# Patient Record
Sex: Female | Born: 1980 | Race: White | Hispanic: No | Marital: Single | Smoking: Never smoker
Health system: Southern US, Community
[De-identification: ages and names within clinical notes are randomized; demographics above are authoritative.]

## PROBLEM LIST (undated history)

## (undated) DIAGNOSIS — F32A Depression, unspecified: Secondary | ICD-10-CM

## (undated) DIAGNOSIS — M810 Age-related osteoporosis without current pathological fracture: Secondary | ICD-10-CM

## (undated) DIAGNOSIS — F329 Major depressive disorder, single episode, unspecified: Secondary | ICD-10-CM

## (undated) DIAGNOSIS — M84376A Stress fracture, unspecified foot, initial encounter for fracture: Secondary | ICD-10-CM

## (undated) DIAGNOSIS — F509 Eating disorder, unspecified: Secondary | ICD-10-CM

## (undated) HISTORY — DX: Major depressive disorder, single episode, unspecified: F32.9

## (undated) HISTORY — DX: Stress fracture, unspecified foot, initial encounter for fracture: M84.376A

## (undated) HISTORY — DX: Eating disorder, unspecified: F50.9

## (undated) HISTORY — DX: Depression, unspecified: F32.A

## (undated) HISTORY — DX: Age-related osteoporosis without current pathological fracture: M81.0

---

## 2011-07-05 DIAGNOSIS — F329 Major depressive disorder, single episode, unspecified: Secondary | ICD-10-CM | POA: Insufficient documentation

## 2011-07-05 DIAGNOSIS — F32A Depression, unspecified: Secondary | ICD-10-CM | POA: Insufficient documentation

## 2011-07-05 DIAGNOSIS — N912 Amenorrhea, unspecified: Secondary | ICD-10-CM | POA: Insufficient documentation

## 2011-07-05 DIAGNOSIS — R001 Bradycardia, unspecified: Secondary | ICD-10-CM | POA: Insufficient documentation

## 2011-07-18 DIAGNOSIS — E559 Vitamin D deficiency, unspecified: Secondary | ICD-10-CM | POA: Insufficient documentation

## 2014-03-10 ENCOUNTER — Ambulatory Visit (INDEPENDENT_AMBULATORY_CARE_PROVIDER_SITE_OTHER): Payer: BC Managed Care – PPO | Admitting: Physician Assistant

## 2014-03-10 VITALS — BP 86/60 | HR 74 | Temp 97.8°F | Resp 18 | Ht 60.0 in | Wt 73.0 lb

## 2014-03-10 DIAGNOSIS — F509 Eating disorder, unspecified: Secondary | ICD-10-CM

## 2014-03-10 DIAGNOSIS — M810 Age-related osteoporosis without current pathological fracture: Secondary | ICD-10-CM

## 2014-03-10 DIAGNOSIS — R109 Unspecified abdominal pain: Secondary | ICD-10-CM

## 2014-03-10 LAB — POCT URINALYSIS DIPSTICK
BILIRUBIN UA: NEGATIVE
Blood, UA: NEGATIVE
GLUCOSE UA: NEGATIVE
Ketones, UA: NEGATIVE
Leukocytes, UA: NEGATIVE
NITRITE UA: NEGATIVE
Protein, UA: NEGATIVE
Spec Grav, UA: 1.01
Urobilinogen, UA: 0.2
pH, UA: 7

## 2014-03-10 LAB — POCT UA - MICROSCOPIC ONLY
Bacteria, U Microscopic: NEGATIVE
Casts, Ur, LPF, POC: NEGATIVE
Crystals, Ur, HPF, POC: NEGATIVE
Epithelial cells, urine per micros: NEGATIVE
Mucus, UA: NEGATIVE
RBC, URINE, MICROSCOPIC: NEGATIVE
WBC, UR, HPF, POC: NEGATIVE
Yeast, UA: NEGATIVE

## 2014-03-10 NOTE — Patient Instructions (Signed)
HOW TO GET YOUR KID UNCLOGGED ASAP (copied from It's No Accident by Dr. Hodges, pp. 54-55)  Initial Clean Out: Start this process on a Friday night, so you have the entire weekend to get the job done. For children lighter than 45 pounds, mix seven doses of MiraLAX or generic brand powder (one dose is a cap filled to the line) in 32 ounces of Gatorade, Pedialyte, or other clear, noncarbonated liquid. Gatorade and Pedialyte help prevent dehydration because they contain electrolytes.  Plus, they taste good, so kids may be more motivated to drink the full dose.  Have your child drink the entire bottle over twenty-four hours. She can eat anything she wants, but should drink only this fluid. Kids weighing between forty-five and eighty pounds should get ten doses, and those weighing at least eighty pounds should receive fourteen doses of laxative in 64 ounces of fluid over the first twenty-four hours.  Don't give her less than this amount! You may even need to give her more.  The endpoint you're shooting for is watery poop.  Your child should pass five or six stools within twenty-four to forty-eight hours.  If she doesn't, keep her on the maintenance dose described below and try the initial clean out program again the next weekend, keeping her on a daily capful of MiraLAX during the week.  Maintenance: Following the bowel clearing, give your child one cap of the laxative in 8 ounces of fluid daily, and have her sit on the toilet after all meals, especially breakfast, using a footstool for support if necessary.  She should blow out while pooping, which will help her bottom muscles relax.   

## 2014-03-10 NOTE — Progress Notes (Signed)
Subjective:    Patient ID: Nancy Douglas, female    DOB: 1981/03/30, 33 y.o.   MRN: [id number]   PCP: No PCP Per Patient  Chief Complaint  Patient presents with  . Abdominal Pain    for a week--left side--front and back pain--no fever--no chills--no vomiting      Active Ambulatory Problems    Diagnosis Date Noted  . Eating disorder    Resolved Ambulatory Problems    Diagnosis Date Noted  . No Resolved Ambulatory Problems   Past Medical History  Diagnosis Date  . Depression   . Osteoporosis     History reviewed. No pertinent past surgical history.  No Known Allergies  Prior to Admission medications   Medication Sig Start Date End Date Taking? Authorizing Provider  FLUoxetine (PROZAC) 20 MG capsule Take 80 mg by mouth daily.   Yes Historical Provider, MD    History   Social History  . Marital Status: Single    Spouse Name: N/A    Number of Children: N/A  . Years of Education: N/A   Social History Main Topics  . Smoking status: Never Smoker   . Smokeless tobacco: None  . Alcohol Use: No  . Drug Use: No  . Sexual Activity: None   Other Topics Concern  . None   Social History Narrative  . None    family history includes Diabetes in her maternal grandmother and paternal grandmother; Heart disease in her paternal grandmother; Hyperlipidemia in her paternal grandfather and paternal grandmother; Stroke in her paternal grandfather and paternal grandmother. indicated that her mother is alive. She indicated that her father is alive. She indicated that both of her brothers are alive. She indicated that her maternal grandmother is deceased. She indicated that her maternal grandfather is deceased. She indicated that her paternal grandmother is deceased. She indicated that her paternal grandfather is deceased.   HPI  This 33 y.o. female presents for evaluation of LEFT flank pain x 1 week.  Nancy Douglas is new to [place], having moved her from [place] in  August after completing graduate work in Psychologist, sport and exercise at [employer].  She has not established with providers in this area for management of her chronic medical problems nor health maintenance.  Her psychiatrist in [place] has been willing to continue filling her fluoxetine thus far. She tried to establish at [facility], but can't get in for several months.  My name appeared on their website's list of recommended providers, and she presents requesting to see me.  She reports that she feels constipated.  She's experienced pain similar to this previously, but pain is worse this time.  More sharp and stabbing. Worse with deep breath.  She's tried Miralax 1-2 doses a day, but without much relief.  Hasn't tried, and never used, stimulant laxatives.  Tried a dose of Magesium Citrate, but felt like her results were just fluids, not stool.  No fever, chills, nausea.  No urinary urgency, frequency or burning.  She notes that she is in a hurry-she is scheduled to meet with a patient in 30 minutes, so would like to avoid diagnostics that would take much time.   Review of Systems As above. No CP, SOB, HA, Dizziness.    Objective:   Physical Exam  Vitals reviewed. Constitutional: She is oriented to person, place, and time. Vital signs are normal. She appears well-developed and well-nourished. She is active and cooperative. No distress.  BP 86/60  Pulse 74  Temp(Src) 97.8 F (36.6  C) (Oral)  Resp 18  Ht 5' (1.524 m)  Wt 73 lb (33.113 kg)  BMI 14.26 kg/m2  SpO2 100%  HENT:  Head: Normocephalic and atraumatic.  Right Ear: Hearing, tympanic membrane, external ear and ear canal normal.  Left Ear: Hearing, tympanic membrane, external ear and ear canal normal.  Nose: Nose normal.  Mouth/Throat: Uvula is midline, oropharynx is clear and moist and mucous membranes are normal.  Considerable enlargement of the parotids bilaterally. Soft and non-tender.  Eyes: Conjunctivae are normal. No scleral  icterus.  Neck: Trachea normal and normal range of motion. Neck supple. No thyromegaly present.  Cardiovascular: Normal rate, regular rhythm, normal heart sounds and normal pulses.   Pulses:      Radial pulses are 2+ on the right side, and 2+ on the left side.  Pulmonary/Chest: Effort normal and breath sounds normal.  Abdominal: Normal appearance and bowel sounds are normal. There is no hepatosplenomegaly. There is tenderness in the suprapubic area, left upper quadrant and left lower quadrant. There is no CVA tenderness. No hernia.  Palpable stool on the LEFT lower abdomen.  Lymphadenopathy:       Head (right side): No tonsillar, no preauricular, no posterior auricular and no occipital adenopathy present.       Head (left side): No tonsillar, no preauricular, no posterior auricular and no occipital adenopathy present.    She has no cervical adenopathy.       Right: No supraclavicular adenopathy present.       Left: No supraclavicular adenopathy present.  Neurological: She is alert and oriented to person, place, and time. No sensory deficit.  Skin: Skin is warm, dry and intact. No rash noted. No cyanosis or erythema. Nails show no clubbing.  Psychiatric: She has a normal mood and affect. Her speech is normal and behavior is normal.      Results for orders placed in visit on 03/10/14  POCT UA - MICROSCOPIC ONLY      Result Value Ref Range   WBC, Ur, HPF, POC neg     RBC, urine, microscopic neg     Bacteria, U Microscopic neg     Mucus, UA neg     Epithelial cells, urine per micros neg     Crystals, Ur, HPF, POC neg     Casts, Ur, LPF, POC neg     Yeast, UA neg    POCT URINALYSIS DIPSTICK      Result Value Ref Range   Color, UA yellow     Clarity, UA clear     Glucose, UA neg     Bilirubin, UA neg     Ketones, UA neg     Spec Grav, UA 1.010     Blood, UA neg     pH, UA 7.0     Protein, UA neg     Urobilinogen, UA 0.2     Nitrite, UA neg     Leukocytes, UA Negative           Assessment & Plan:  1. Abdominal pain, unspecified abdominal location She's in a hurry, and so we elect to defer additional evaluation for now.  If her symptoms persist, she'll return. Otherwise, try a Miralax clean-out.  Based on her weight, 10 doses of Miralax in 64 ounces of Gatorade.  Anticipatory guidance provided. - POCT UA - Microscopic Only - POCT urinalysis dipstick  2. Eating disorder I'm happy to prescribe fluoxetine. She's going to work on establishing with a nutritionist and therapist  locally.  3. Osteoporosis Will need to update this 2 years from her last DEXA.   Fernande Bras, PA-C Physician Assistant-Certified [facility]

## 2014-04-06 ENCOUNTER — Ambulatory Visit (INDEPENDENT_AMBULATORY_CARE_PROVIDER_SITE_OTHER): Payer: BC Managed Care – PPO

## 2014-04-06 ENCOUNTER — Ambulatory Visit (INDEPENDENT_AMBULATORY_CARE_PROVIDER_SITE_OTHER): Payer: BC Managed Care – PPO | Admitting: Physician Assistant

## 2014-04-06 VITALS — BP 84/64 | HR 57 | Temp 97.5°F | Resp 16 | Ht 59.25 in | Wt 76.0 lb

## 2014-04-06 DIAGNOSIS — M79641 Pain in right hand: Secondary | ICD-10-CM

## 2014-04-06 DIAGNOSIS — M810 Age-related osteoporosis without current pathological fracture: Secondary | ICD-10-CM

## 2014-04-06 DIAGNOSIS — M79672 Pain in left foot: Secondary | ICD-10-CM

## 2014-04-06 DIAGNOSIS — M79671 Pain in right foot: Secondary | ICD-10-CM

## 2014-04-06 DIAGNOSIS — Z87312 Personal history of (healed) stress fracture: Secondary | ICD-10-CM

## 2014-04-06 NOTE — Patient Instructions (Signed)
Try to rest your heels in case a stress fracture is forming again - recumbent bike or elliptical machine would be better than running/walking for exercise. Work on range of motion exercises for your right thumb.  Start taking calcium and vitamin D daily.

## 2014-04-06 NOTE — Progress Notes (Signed)
Subjective:    Patient ID: Nancy Douglas, female    DOB: May 16, 1981, 33 y.o.   MRN: [id number] Patient Active Problem List   Diagnosis Date Noted  . Osteoporosis 03/10/2014  . Eating disorder    Prior to Admission medications   Medication Sig Start Date End Date Taking? Authorizing Provider  FLUoxetine (PROZAC) 20 MG capsule Take 80 mg by mouth daily.   Yes Historical Provider, MD   Hand Pain   Foot Pain Associated symptoms include arthralgias and joint swelling. Pertinent negatives include no headaches.    This is a 33 year old right hand dominant female with PMH eating disorder and osteoporosis who is presenting with right hand pain and swelling x 3 days. She reports she tripped going up stairs and caught herself with her right hand outstretched in front of her. She reports she is stiff and sore.  She took ibuprofen one time which helped some. She has never had an injury in her right hand before. She is not having weakness or paresthesias.   She is also reporting bilateral heel pain for 2 weeks. She reports around January of 2015 she started having bilateral heel pain and was diagnosed with bilateral calcaneal stress fractures. The left heel stress fracture was diagnosed by xray, while the right was diagnosed by MRI. She reports she wore a boot intermittently but only when the heel pain was acute. She mostly treated with rest. Her symptoms eventually completely resolved. She reports the pain she is having now is the same as prior. She still has a boot at home. She does not take calcium or vitamin D on a regular basis. She runs for exercise.  Review of Systems  Constitutional: Negative.   Musculoskeletal: Positive for arthralgias and joint swelling.  Skin: Negative.   Neurological: Negative for headaches.      Objective:   Physical Exam  Constitutional: She is oriented to person, place, and time. She appears well-developed and well-nourished. No distress.  HENT:  Head:  Normocephalic and atraumatic.  Right Ear: Hearing normal.  Left Ear: Hearing normal.  Nose: Nose normal.  Bilateral parotid gland swelling  Eyes: Conjunctivae and lids are normal. Right eye exhibits no discharge. Left eye exhibits no discharge. No scleral icterus.  Cardiovascular: Intact distal pulses.  Bradycardia present.   Pulmonary/Chest: Effort normal. No respiratory distress.  Musculoskeletal: Normal range of motion.       Right hand: She exhibits tenderness (1st metacarpal head), bony tenderness and swelling. She exhibits normal range of motion and normal capillary refill. Normal sensation noted. Decreased strength (4+/5) noted. She exhibits thumb/finger opposition. She exhibits no finger abduction and no wrist extension trouble.       Right foot: She exhibits tenderness (medial calcaneus). She exhibits no swelling.       Left foot: She exhibits tenderness (medial calcaneus). She exhibits normal range of motion and no swelling.  No scaphoid tenderness  Neurological: She is alert and oriented to person, place, and time. She has normal reflexes. No sensory deficit.  Reflex Scores:      Bicep reflexes are 2+ on the right side and 2+ on the left side.      Brachioradialis reflexes are 2+ on the right side and 2+ on the left side.      Achilles reflexes are 2+ on the right side and 2+ on the left side. Skin: Skin is warm, dry and intact. No lesion and no rash noted.  Psychiatric: She has a normal mood and  affect. Her speech is normal and behavior is normal. Thought content normal.   [facility] reading (PRIMARY) by  Dr. Merla Richesoolittle: right hand, right wrist and bilateral calcaneal radiograph all negative for acute fracture.      Assessment & Plan:  1. Heel pain, bilateral 2. Personal history of stress fracture  There is no evidence of fracture on bilateral calcaneal radiographs. Patient does not want an ortho referral at this time. She will return if her pain worsens. We discussed the need to  rest and do lower impact exercise. Presently, she runs a few times a week. She discussed a recumbent bike or elliptical machine would be better than running.  - DG Os Calcis Right; Future - DG Os Calcis Left; Future  3. Hand pain, right  Radiograph is negative for fracture. This is likely a muscle strain. We discussed range of motion exercises to complete at home.  - DG Hand Complete Right; Future - DG Wrist Complete Right; Future  4. Osteoporosis Patient has a history of osteoporosis d/t eating disorder. She does not currently take calcium or vitamin D. We discussed the importance of taking this daily to prevent fractures and accelerate bone healing.  Roswell MinersNicole V. Dyke BrackettBush, PA-C, MHS [facility]  04/06/2014

## 2014-04-09 NOTE — Progress Notes (Signed)
I was directly involved with the patient's care and agree with the physical, diagnosis and treatment plan.  

## 2014-12-23 ENCOUNTER — Ambulatory Visit: Payer: Self-pay | Admitting: *Deleted

## 2015-01-06 ENCOUNTER — Ambulatory Visit: Payer: Self-pay | Admitting: *Deleted

## 2015-04-20 ENCOUNTER — Ambulatory Visit (INDEPENDENT_AMBULATORY_CARE_PROVIDER_SITE_OTHER): Payer: BLUE CROSS/BLUE SHIELD | Admitting: Physician Assistant

## 2015-04-20 ENCOUNTER — Encounter: Payer: Self-pay | Admitting: Physician Assistant

## 2015-04-20 VITALS — BP 89/51 | HR 59 | Temp 98.0°F | Resp 16 | Ht 59.5 in | Wt 80.4 lb

## 2015-04-20 DIAGNOSIS — Z13228 Encounter for screening for other metabolic disorders: Secondary | ICD-10-CM

## 2015-04-20 DIAGNOSIS — Z1329 Encounter for screening for other suspected endocrine disorder: Secondary | ICD-10-CM | POA: Diagnosis not present

## 2015-04-20 DIAGNOSIS — Z114 Encounter for screening for human immunodeficiency virus [HIV]: Secondary | ICD-10-CM | POA: Diagnosis not present

## 2015-04-20 DIAGNOSIS — M810 Age-related osteoporosis without current pathological fracture: Secondary | ICD-10-CM

## 2015-04-20 DIAGNOSIS — Z13220 Encounter for screening for lipoid disorders: Secondary | ICD-10-CM

## 2015-04-20 DIAGNOSIS — F509 Eating disorder, unspecified: Secondary | ICD-10-CM | POA: Diagnosis not present

## 2015-04-20 DIAGNOSIS — F329 Major depressive disorder, single episode, unspecified: Secondary | ICD-10-CM | POA: Diagnosis not present

## 2015-04-20 DIAGNOSIS — F32A Depression, unspecified: Secondary | ICD-10-CM

## 2015-04-20 DIAGNOSIS — Z130 Encounter for screening for diseases of the blood and blood-forming organs and certain disorders involving the immune mechanism: Secondary | ICD-10-CM

## 2015-04-20 DIAGNOSIS — Z87312 Personal history of (healed) stress fracture: Secondary | ICD-10-CM

## 2015-04-20 DIAGNOSIS — Z23 Encounter for immunization: Secondary | ICD-10-CM

## 2015-04-20 DIAGNOSIS — Z0000 Encounter for general adult medical examination without abnormal findings: Secondary | ICD-10-CM | POA: Diagnosis not present

## 2015-04-20 LAB — CBC WITH DIFFERENTIAL/PLATELET
BASOS PCT: 0 % (ref 0–1)
Basophils Absolute: 0 10*3/uL (ref 0.0–0.1)
EOS ABS: 0 10*3/uL (ref 0.0–0.7)
EOS PCT: 1 % (ref 0–5)
HCT: 35.9 % — ABNORMAL LOW (ref 36.0–46.0)
Hemoglobin: 11.9 g/dL — ABNORMAL LOW (ref 12.0–15.0)
Lymphocytes Relative: 26 % (ref 12–46)
Lymphs Abs: 1.2 10*3/uL (ref 0.7–4.0)
MCH: 28.5 pg (ref 26.0–34.0)
MCHC: 33.1 g/dL (ref 30.0–36.0)
MCV: 85.9 fL (ref 78.0–100.0)
MONOS PCT: 7 % (ref 3–12)
MPV: 8.2 fL — AB (ref 8.6–12.4)
Monocytes Absolute: 0.3 10*3/uL (ref 0.1–1.0)
NEUTROS PCT: 66 % (ref 43–77)
Neutro Abs: 3.2 10*3/uL (ref 1.7–7.7)
Platelets: 295 10*3/uL (ref 150–400)
RBC: 4.18 MIL/uL (ref 3.87–5.11)
RDW: 14.2 % (ref 11.5–15.5)
WBC: 4.8 10*3/uL (ref 4.0–10.5)

## 2015-04-20 LAB — TSH: TSH: 1.177 u[IU]/mL (ref 0.350–4.500)

## 2015-04-20 NOTE — Patient Instructions (Signed)
I will contact you with your lab results as soon as they are available.   If you have not heard from me in 2 weeks, please contact me.  The fastest way to get your results is to register for My Chart (see the instructions on the last page of this printout).  Keeping You Healthy  Get These Tests 1. Blood Pressure- Have your blood pressure checked once a year by your health care provider.  Normal blood pressure is 120/80. 2. Weight- Have your body mass index (BMI) calculated to screen for obesity.  BMI is measure of body fat based on height and weight.  You can also calculate your own BMI at www.nhlbisupport.com/bmi/. 3. Cholesterol- Have your cholesterol checked every 5 years starting at age 20 then yearly starting at age 45. 4. Chlamydia, HIV, and other sexually transmitted diseases- Get screened every year until age 25, then within three months of each new sexual provider. 5. Pap Test - Every 1-5 years; discuss with your health care provider. 6. Mammogram- Every 1-2 years starting at age 40--50  Take these medicines  Calcium with Vitamin D-Your body needs 1200 mg of Calcium each day and 800-1000 IU of Vitamin D daily.  Your body can only absorb 500 mg of Calcium at a time so Calcium must be taken in 2 or 3 divided doses throughout the day.  Multivitamin with folic acid- Once daily if it is possible for you to become pregnant.  Get these Immunizations  Gardasil-Series of three doses; prevents HPV related illness such as genital warts and cervical cancer.  Menactra-Single dose; prevents meningitis.  Tetanus shot- Every 10 years.  Flu shot-Every year.  Take these steps 1. Do not smoke-Your healthcare provider can help you quit.  For tips on how to quit go to www.smokefree.gov or call 1-800 QUITNOW. 2. Be physically active- Exercise 5 days a week for at least 30 minutes.  If you are not already physically active, start slow and gradually work up to 30 minutes of moderate physical  activity.  Examples of moderate activity include walking briskly, dancing, swimming, bicycling, etc. 3. Breast Cancer- A self breast exam every month is important for early detection of breast cancer.  For more information and instruction on self breast exams, ask your healthcare provider or www.womenshealth.gov/faq/breast-self-exam.cfm. 4. Eat a healthy diet- Eat a variety of healthy foods such as fruits, vegetables, whole grains, low fat milk, low fat cheeses, yogurt, lean meats, poultry and fish, beans, nuts, tofu, etc.  For more information go to www. Thenutritionsource.org 5. Drink alcohol in moderation- Limit alcohol intake to one drink or less per day. Never drink and drive. 6. Depression- Your emotional health is as important as your physical health.  If you're feeling down or losing interest in things you normally enjoy please talk to your healthcare provider about being screened for depression. 7. Dental visit- Brush and floss your teeth twice daily; visit your dentist twice a year. 8. Eye doctor- Get an eye exam at least every 2 years. 9. Helmet use- Always wear a helmet when riding a bicycle, motorcycle, rollerblading or skateboarding. 10. Safe sex- If you may be exposed to sexually transmitted infections, use a condom. 11. Seat belts- Seat belts can save your live; always wear one. 12. Smoke/Carbon Monoxide detectors- These detectors need to be installed on the appropriate level of your home. Replace batteries at least once a year. 13. Skin cancer- When out in the sun please cover up and use sunscreen 15 SPF   or higher. 14. Violence- If anyone is threatening or hurting you, please tell your healthcare provider.        

## 2015-04-20 NOTE — Progress Notes (Signed)
Patient ID: Nancy Douglas, female    DOB: Nov 15, 1980, 34 y.o.   MRN: [id number]  PCP: No PCP Per Patient  Chief Complaint  Patient presents with  . Annual Exam    no pap smear  . Depression    in triage answers positive SEE LAST ANSWER    Subjective:   HPI: Presents for annual wellness exam. I last saw her in 02/2014.  Today she desires to establish with me for primary care and have a physical, "I've never had one."  Isn't sexually active. Never has been. She is therefore very low risk for HPV and cervical cancer, and does not want screening today.  Depression currently managed by Deatra Robinson, NP with [facility]. Feels like things are stable in that respect. Has a good rapport with Ms. Yetta Barre. Didn't find talk therapy helpful so stopped attending those sessions.  Had a metatarsal stress fracture last fall, and has had one prior to that. Saw [place] in 03/2014 and wasn't interested in speciality care. Then self-referred [facility], but didn't follow-up. Has been  Cycling. Wants to run again. Thinks that her stress fracture is healed, but sometimes has some discomfort. Isn't taking supplemental calcium or vitamin D. Osteoporosis on DEXA in 2015, previously on Vit D 50,000 IU.   Patient Active Problem List   Diagnosis Date Noted  . Osteoporosis 03/10/2014  . Eating disorder   . Vitamin D deficiency 07/18/2011  . Absence of menstruation 07/05/2011  . Bradycardia 07/05/2011  . Clinical depression 07/05/2011    Past Medical History  Diagnosis Date  . Depression   . Osteoporosis   . Eating disorder   . Stress fracture of metatarsal bone      Prior to Admission medications   Medication Sig Start Date End Date Taking? Authorizing Provider  FLUoxetine (PROZAC) 20 MG capsule Take 60 mg by mouth daily.    Yes Historical Provider, MD    No Known Allergies  History reviewed. No pertinent past surgical history.  Family History  Problem Relation Age  of Onset  . Diabetes Maternal Grandmother   . Diabetes Paternal Grandmother   . Heart disease Paternal Grandmother   . Hyperlipidemia Paternal Grandmother   . Stroke Paternal Grandmother   . Stroke Paternal Grandfather   . Hyperlipidemia Paternal Grandfather   . Mental illness Brother     depression  . Mental illness Brother     depression    Social History   Social History  . Marital Status: Single    Spouse Name: n/a  . Number of Children: 0  . Years of Education: N/A   Occupational History  . speech pathologist    Social History Main Topics  . Smoking status: Never Smoker   . Smokeless tobacco: Never Used  . Alcohol Use: No  . Drug Use: No  . Sexual Activity: No     Comment: never sexually active   Other Topics Concern  . None   Social History Narrative   Lives alone.   Graduated from [employer].       Review of Systems  Constitutional: Negative.   HENT: Positive for sore throat (x 1 week, I feel like it's from the change in the weather and being around sick clients; ibuprofen, cough drops with temporary relief.).   Eyes: Negative.   Respiratory: Negative.   Cardiovascular: Negative.   Gastrointestinal: Negative.   Genitourinary: Positive for urgency (intermittent, can be several times a day; a couple of  times each month), frequency and menstrual problem (amenorrhea). Negative for dysuria and hematuria.  Musculoskeletal: Negative.   Skin: Negative.   Allergic/Immunologic: Negative.   Neurological: Negative.   Hematological: Negative.   Psychiatric/Behavioral: Positive for suicidal ideas (no intention/plan; family, sense of responsibility and perseverence) and dysphoric mood. The patient is nervous/anxious.         Objective:  Physical Exam  Constitutional: She is oriented to person, place, and time. Vital signs are normal. She appears well-developed and well-nourished. She is active and cooperative. No distress.  BP 89/51 mmHg  Pulse 59  Temp(Src) 98  F (36.7 C) (Oral)  Resp 16  Ht 4' 11.5" (1.511 m)  Wt 80 lb 6.4 oz (36.469 kg)  BMI 15.97 kg/m2  LMP    HENT:  Head: Normocephalic and atraumatic.  Right Ear: Hearing, tympanic membrane, external ear and ear canal normal. No foreign bodies.  Left Ear: Hearing, tympanic membrane, external ear and ear canal normal. No foreign bodies.  Nose: Nose normal.  Mouth/Throat: Uvula is midline, oropharynx is clear and moist and mucous membranes are normal. No oral lesions. Normal dentition. No dental abscesses or uvula swelling. No oropharyngeal exudate.  Marked parotid enlargement, her baseline  Eyes: Conjunctivae, EOM and lids are normal. Pupils are equal, round, and reactive to light. Right eye exhibits no discharge. Left eye exhibits no discharge. No scleral icterus.  Fundoscopic exam:      The right eye shows no arteriolar narrowing, no AV nicking, no exudate, no hemorrhage and no papilledema. The right eye shows red reflex.       The left eye shows no arteriolar narrowing, no AV nicking, no exudate, no hemorrhage and no papilledema. The left eye shows red reflex.  Neck: Trachea normal, normal range of motion and full passive range of motion without pain. Neck supple. No spinous process tenderness and no muscular tenderness present. No thyroid mass and no thyromegaly present.  Cardiovascular: Normal rate, regular rhythm, normal heart sounds, intact distal pulses and normal pulses.   Pulmonary/Chest: Effort normal and breath sounds normal. Right breast exhibits no inverted nipple, no mass, no nipple discharge, no skin change and no tenderness. Left breast exhibits no inverted nipple, no mass, no nipple discharge, no skin change and no tenderness. Breasts are symmetrical.  Abdominal: Soft. She exhibits no distension and no mass. There is no tenderness. There is no rebound and no guarding.  Musculoskeletal: She exhibits no edema or tenderness.       Cervical back: Normal.       Thoracic back:  Normal.       Lumbar back: Normal.  Lymphadenopathy:       Head (right side): No tonsillar, no preauricular, no posterior auricular and no occipital adenopathy present.       Head (left side): No tonsillar, no preauricular, no posterior auricular and no occipital adenopathy present.    She has no cervical adenopathy.       Right: No supraclavicular adenopathy present.       Left: No supraclavicular adenopathy present.  Neurological: She is alert and oriented to person, place, and time. She has normal strength and normal reflexes. No cranial nerve deficit. She exhibits normal muscle tone. Coordination and gait normal.  Skin: Skin is warm, dry and intact. No rash noted. She is not diaphoretic. No cyanosis or erythema. Nails show no clubbing.  Psychiatric: She has a normal mood and affect. Her speech is normal and behavior is normal. Judgment and thought content normal.  Assessment & Plan:  1. Annual physical exam Age appropriate anticipatory guidance provided.  2. Need for influenza vaccination - Flu Vaccine QUAD 36+ mos IM  3. Screening for HIV (human immunodeficiency virus) - HIV antibody  4. Clinical depression Continue with Ms. Yetta Barre and current medication. Encouraged her to be alert to worsening symptoms and reach out to either of [place]. No intention or plan for self harm.  5. Osteoporosis Update DEXA in 2017. Resume calcium supplementation. Await Vit D level, may need Rx strength supplementation. - Vit D  25 hydroxy (rtn osteoporosis monitoring) - Ambulatory referral to Orthopedic Surgery  6. Eating disorder Continue nutrition counseling and fluoxetine.  7. Personal history of stress fracture Encouraged low/no impact exercise, as she is at very high risk for stress injury with running. Resume calcium supplementation. Await Vit D level, may need Rx strength supplementation. - Ambulatory referral to Orthopedic Surgery  8. Need for Tdap vaccination - Tdap vaccine  greater than or equal to 7yo IM  9. Screening for thyroid disorder - TSH  10. Screening for metabolic disorder - Comprehensive metabolic panel  11. Screening for deficiency anemia - CBC with Differential/Platelet  12. Screening for hyperlipidemia - Lipid panel   Fernande Bras, PA-C Physician Assistant-Certified [facility]

## 2015-04-21 LAB — LIPID PANEL
CHOL/HDL RATIO: 3.9 ratio (ref <=5.0)
CHOLESTEROL: 220 mg/dL — AB (ref 125–200)
HDL: 57 mg/dL (ref >=46)
LDL Cholesterol: 127 mg/dL (ref <130)
Triglycerides: 182 mg/dL — ABNORMAL HIGH (ref <150)
VLDL: 36 mg/dL — ABNORMAL HIGH (ref <30)

## 2015-04-21 LAB — COMPREHENSIVE METABOLIC PANEL
ALT: 29 U/L (ref 6–29)
AST: 48 U/L — ABNORMAL HIGH (ref 10–30)
Albumin: 4.2 g/dL (ref 3.6–5.1)
Alkaline Phosphatase: 46 U/L (ref 33–115)
BUN: 10 mg/dL (ref 7–25)
CO2: 31 mmol/L (ref 20–31)
Calcium: 9.3 mg/dL (ref 8.6–10.2)
Chloride: 100 mmol/L (ref 98–110)
Creat: 0.72 mg/dL (ref 0.50–1.10)
Glucose, Bld: 73 mg/dL (ref 65–99)
Potassium: 4.1 mmol/L (ref 3.5–5.3)
Sodium: 137 mmol/L (ref 135–146)
Total Bilirubin: 0.4 mg/dL (ref 0.2–1.2)
Total Protein: 6.5 g/dL (ref 6.1–8.1)

## 2015-04-21 LAB — VITAMIN D 25 HYDROXY (VIT D DEFICIENCY, FRACTURES): Vit D, 25-Hydroxy: 21 ng/mL — ABNORMAL LOW (ref 30–100)

## 2015-04-21 LAB — HIV ANTIBODY (ROUTINE TESTING W REFLEX): HIV 1&2 Ab, 4th Generation: NONREACTIVE

## 2015-04-26 ENCOUNTER — Encounter: Payer: Self-pay | Admitting: Physician Assistant

## 2015-05-07 ENCOUNTER — Telehealth: Payer: Self-pay

## 2015-05-07 NOTE — Telephone Encounter (Signed)
Left message for pt to call back  °

## 2015-05-07 NOTE — Telephone Encounter (Signed)
Based on her age and risk factors, I would not recommend prescription medication to lower her cholesterol (she currently has a lower than average risk of cardiovascular disease based on these numbers).  If she is interested in doing "more" than healthy eating choices, I recommend OTC Fish Oil, 1000-2400 mg daily. Eating fish with Omega 3 fatty acids are another great way to get this.

## 2015-05-07 NOTE — Telephone Encounter (Signed)
Message for Chelle. Pt has questions about recent lab results.  Please advise  [phone]

## 2015-05-07 NOTE — Telephone Encounter (Signed)
Pt wanted to know if there anything she should do about her cholesterol totals?  She states that this runs in her family and also the last time she had these labs done they were normal.

## 2015-06-30 ENCOUNTER — Ambulatory Visit (INDEPENDENT_AMBULATORY_CARE_PROVIDER_SITE_OTHER): Payer: BLUE CROSS/BLUE SHIELD | Admitting: Physician Assistant

## 2015-06-30 VITALS — BP 104/64 | HR 58 | Temp 98.0°F | Resp 17 | Ht 59.0 in | Wt 81.0 lb

## 2015-06-30 DIAGNOSIS — S60512A Abrasion of left hand, initial encounter: Secondary | ICD-10-CM | POA: Diagnosis not present

## 2015-06-30 DIAGNOSIS — W5503XA Scratched by cat, initial encounter: Secondary | ICD-10-CM

## 2015-06-30 MED ORDER — MUPIROCIN 2 % EX OINT: 1.0000 "application " | TOPICAL_OINTMENT | Freq: Two times a day (BID) | CUTANEOUS | Status: AC

## 2015-06-30 MED ORDER — AMOXICILLIN-POT CLAVULANATE 875-125 MG PO TABS: 1.0000 | ORAL_TABLET | Freq: Two times a day (BID) | ORAL | Status: AC

## 2015-06-30 NOTE — Patient Instructions (Signed)

## 2015-06-30 NOTE — Progress Notes (Signed)
[facility] [address], [place] [address] [phone]  Date:  06/30/2015   Name:  Nancy Douglas   DOB:  01-Mar-1981   MRN:  [id number]  PCP:  No PCP Per Patient   Chief Complaint  Patient presents with  . Finger Injury   History of Present Illness:  Nancy Douglas is a 35 y.o. female patient who presents to [facility] for chief complaint of left index finger pain and swelling. She was trying to remove her cat from the car, as it was spooked.  The cat then scratched, and possibly bit her.  Over the last 2 nights she has pain and started to have swelling down the finger.   She has no fever, or nausea.  Patient Active Problem List   Diagnosis Date Noted  . Osteoporosis 03/10/2014  . Eating disorder   . Vitamin D deficiency 07/18/2011  . Absence of menstruation 07/05/2011  . Bradycardia 07/05/2011  . Clinical depression 07/05/2011    Past Medical History  Diagnosis Date  . Depression   . Osteoporosis   . Eating disorder   . Stress fracture of metatarsal bone     No past surgical history on file.  Social History  Substance Use Topics  . Smoking status: Never Smoker   . Smokeless tobacco: Never Used  . Alcohol Use: No    Family History  Problem Relation Age of Onset  . Diabetes Maternal Grandmother   . Diabetes Paternal Grandmother   . Heart disease Paternal Grandmother   . Hyperlipidemia Paternal Grandmother   . Stroke Paternal Grandmother   . Stroke Paternal Grandfather   . Hyperlipidemia Paternal Grandfather   . Mental illness Brother     depression  . Mental illness Brother     depression    No Known Allergies  Medication list has been reviewed and updated.  No current outpatient prescriptions on file prior to visit.   No current facility-administered medications on file prior to visit.    ROS ROS otherwise unremarkable unless listed above.   Physical Examination: BP 104/64 mmHg  Pulse 58  Temp(Src) 98 F (36.7 C)  (Oral)  Resp 17  Ht  (1.499 m)  Wt 81 lb (36.741 kg)  BMI 16.35 kg/m2  SpO2 98% Ideal Body Weight: Weight in (lb) to have BMI = 25: 123.5  Physical Exam  Constitutional: She is oriented to person, place, and time. She appears well-developed and well-nourished. No distress.  HENT:  Head: Normocephalic and atraumatic.  Right Ear: External ear normal.  Left Ear: External ear normal.  Eyes: Conjunctivae and EOM are normal. Pupils are equal, round, and reactive to light.  Cardiovascular: Normal rate.   Pulmonary/Chest: Effort normal. No respiratory distress.  Neurological: She is alert and oriented to person, place, and time.  Skin: She is not diaphoretic.  Left finger with 1cm laceration between dip and pip of 2nd finger.  There are erythematous excoriations along dorsal hand, and small dotted red lesions consistent with a possible puncture wound.   Normal rom, sensation intact. Normal capillary refill.  No climbing erythema up the arm.    Psychiatric: She has a normal mood and affect. Her behavior is normal.     Assessment and Plan: Nancy Douglas is a 35 y.o. female who is here today for cc of left hand pain. -given augmentin and bacitracin.  Advised cleansing and alarming factors that warrant immediate return.  Patient voiced understanding.   Cat scratch of hand,  left, initial encounter - Plan: amoxicillin-clavulanate (AUGMENTIN) 875-125 MG tablet, mupirocin ointment (BACTROBAN) 2 %   Trena Platt, PA-C [facility] 06/30/2015 1:53 PM

## 2015-07-05 ENCOUNTER — Encounter: Payer: Self-pay | Admitting: Physician Assistant

## 2015-09-30 ENCOUNTER — Ambulatory Visit (INDEPENDENT_AMBULATORY_CARE_PROVIDER_SITE_OTHER): Payer: BLUE CROSS/BLUE SHIELD

## 2015-09-30 ENCOUNTER — Ambulatory Visit (INDEPENDENT_AMBULATORY_CARE_PROVIDER_SITE_OTHER): Payer: BLUE CROSS/BLUE SHIELD | Admitting: Physician Assistant

## 2015-09-30 VITALS — BP 102/68 | HR 80 | Temp 97.9°F | Resp 16 | Ht 59.0 in | Wt 75.6 lb

## 2015-09-30 DIAGNOSIS — R1084 Generalized abdominal pain: Secondary | ICD-10-CM | POA: Diagnosis not present

## 2015-09-30 DIAGNOSIS — R5383 Other fatigue: Secondary | ICD-10-CM | POA: Diagnosis not present

## 2015-09-30 LAB — POCT CBC
Granulocyte percent: 66.3 %G (ref 37–80)
HEMATOCRIT: 38.1 % (ref 37.7–47.9)
Hemoglobin: 13.5 g/dL (ref 12.2–16.2)
Lymph, poc: 1.6 (ref 0.6–3.4)
MCH: 31.2 pg (ref 27–31.2)
MCHC: 35.3 g/dL (ref 31.8–35.4)
MCV: 88.3 fL (ref 80–97)
MID (CBC): 0.2 (ref 0–0.9)
MPV: 5.8 fL (ref 0–99.8)
POC GRANULOCYTE: 3.6 (ref 2–6.9)
POC LYMPH PERCENT: 29.6 %L (ref 10–50)
POC MID %: 4.1 % (ref 0–12)
Platelet Count, POC: 280 10*3/uL (ref 142–424)
RBC: 4.31 M/uL (ref 4.04–5.48)
RDW, POC: 13.9 %
WBC: 5.5 10*3/uL (ref 4.6–10.2)

## 2015-09-30 LAB — COMPREHENSIVE METABOLIC PANEL
ALK PHOS: 44 U/L (ref 33–115)
ALT: 38 U/L — AB (ref 6–29)
AST: 55 U/L — ABNORMAL HIGH (ref 10–30)
Albumin: 4.5 g/dL (ref 3.6–5.1)
BUN: 15 mg/dL (ref 7–25)
CALCIUM: 9.6 mg/dL (ref 8.6–10.2)
CHLORIDE: 88 mmol/L — AB (ref 98–110)
CO2: 30 mmol/L (ref 20–31)
Creat: 0.79 mg/dL (ref 0.50–1.10)
GLUCOSE: 75 mg/dL (ref 65–99)
POTASSIUM: 3.8 mmol/L (ref 3.5–5.3)
Sodium: 133 mmol/L — ABNORMAL LOW (ref 135–146)
TOTAL PROTEIN: 6.6 g/dL (ref 6.1–8.1)
Total Bilirubin: 0.5 mg/dL (ref 0.2–1.2)

## 2015-09-30 LAB — TSH: TSH: 1.28 m[IU]/L

## 2015-09-30 NOTE — Patient Instructions (Addendum)
     IF you received an x-ray today, you will receive an invoice from [facility]. Please contact [facility] at [phone] with questions or concerns regarding your invoice.   IF you received labwork today, you will receive an invoice from [facility]. Please contact [facility] at [phone] with questions or concerns regarding your invoice.   Our billing staff will not be able to assist you with questions regarding bills from these companies.  You will be contacted with the lab results as soon as they are available. The fastest way to get your results is to activate your My Chart account. Instructions are located on the last page of this paperwork. If you have not heard from us regarding the results in 2 weeks, please contact this office.     HOW TO GET YOUR KID UNCLOGGED ASAP (copied from It's No Accident by Dr. Yetta FlockHodges, pp. [phone])  Initial Clean Out: Start this process on a Friday night, so you have the entire weekend to get the job done. For children lighter than 45 pounds, mix seven doses of MiraLAX or generic brand powder (one dose is a cap filled to the line) in 32 ounces of Gatorade, Pedialyte, or other clear, noncarbonated liquid. Gatorade and Pedialyte help prevent dehydration because they contain electrolytes.  Plus, they taste good, so kids may be more motivated to drink the full dose.  Have your child drink the entire bottle over twenty-four hours. She can eat anything she wants, but should drink only this fluid. Kids weighing between forty-five and eighty pounds should get ten doses, and those weighing at least eighty pounds should receive fourteen doses of laxative in 64 ounces of fluid over the first twenty-four hours.  Don't give her less than this amount! You may even need to give her more.  The endpoint you're shooting for is watery poop.  Your child should pass five or six stools within twenty-four to forty-eight hours.  If she  doesn't, keep her on the maintenance dose described below and try the initial clean out program again the next weekend, keeping her on a daily capful of MiraLAX during the week.

## 2015-09-30 NOTE — Progress Notes (Signed)
Patient ID: Nancy Douglas, female    DOB: 11/02/1980, 35 y.o.   MRN: [id number]  PCP: No PCP Per Patient  Subjective:   Chief Complaint  Patient presents with  . Fatigue    upper back pain around rib area x 1 week ago  . Constipation    HPI Presents for evaluation of fatigue and feeling poorly x about 10 days.  Wasn't sure if she really wanted to come in. "Nothing has changed," with regard to her eating. "Feeling really lousy" "May be constipated. Just want to make sure there's not something else." Uses Miralax up to daily. Stools are soft, but not as regular as usual. Extra fatigued and tired. Headache. "My stomach hurts. I'm backed up." Feverish. 3 days last week she had terrible pain in the posterior RIGHT shoulder. That resolved. Now has pain on the LEFT back and shoulder blade.  Not more depressed than usual. No suicidal or homicidal thoughts.  Review of Systems As above.    Patient Active Problem List   Diagnosis Date Noted  . Osteoporosis 03/10/2014  . Eating disorder   . Vitamin D deficiency 07/18/2011  . Absence of menstruation 07/05/2011  . Bradycardia 07/05/2011  . Clinical depression 07/05/2011     Prior to Admission medications   Medication Sig Start Date End Date Taking? Authorizing Provider  FLUoxetine (PROZAC) 40 MG capsule Take 40 mg by mouth daily. Take 2 capsules by mouth daily   Yes Historical Provider, MD  mupirocin ointment (BACTROBAN) 2 % Apply 1 application topically 2 (two) times daily. 06/30/15  Yes Stephanie D English, PA     No Known Allergies     Objective:  Physical Exam  Constitutional: She is oriented to person, place, and time. Vital signs are normal. She appears well-developed and well-nourished. She is active and cooperative. No distress.  BP 89/51 mmHg  Pulse 59  Temp(Src) 98 F (36.7 C) (Oral)  Resp 16  Ht 4' 11.5" (1.511 m)  Wt 80 lb 6.4 oz (36.469 kg)  BMI 15.97 kg/m2  LMP    HENT:  Head: Normocephalic and  atraumatic.  Right Ear: Hearing normal.  Left Ear: Hearing normal.  Marked parotid enlargement, her baseline  Eyes: Conjunctivae, EOM and lids are normal. Pupils are equal, round, and reactive to light. Right eye exhibits no discharge. Left eye exhibits no discharge. No scleral icterus.  Neck: Trachea normal, normal range of motion and full passive range of motion without pain. Neck supple. No spinous process tenderness and no muscular tenderness present. No thyroid mass and no thyromegaly present.  Cardiovascular: Normal rate, regular rhythm, normal heart sounds, intact distal pulses and normal pulses.   Pulmonary/Chest: Effort normal and breath sounds normal. Right breast exhibits no inverted nipple, no mass, no nipple discharge, no skin change and no tenderness. Left breast exhibits no inverted nipple, no mass, no nipple discharge, no skin change and no tenderness. Breasts are symmetrical.  Abdominal: Soft. Bowel sounds are normal. She exhibits no distension and no mass. There is no hepatosplenomegaly. There is tenderness (generalized, but worst in the LLQ). There is no rebound and no guarding.  Musculoskeletal: She exhibits no edema or tenderness.       Cervical back: Normal.       Thoracic back: Normal.       Lumbar back: Normal.  Lymphadenopathy:       Head (right side): No tonsillar, no preauricular, no posterior auricular and no occipital adenopathy present.  Head (left side): No tonsillar, no preauricular, no posterior auricular and no occipital adenopathy present.    She has no cervical adenopathy.       Right: No supraclavicular adenopathy present.       Left: No supraclavicular adenopathy present.  Neurological: She is alert and oriented to person, place, and time. She has normal strength and normal reflexes. No cranial nerve deficit. She exhibits normal muscle tone. Coordination and gait normal.  Skin: Skin is warm, dry and intact. No rash noted. She is not diaphoretic. No  cyanosis or erythema. Nails show no clubbing.  Psychiatric: She has a normal mood and affect. Her speech is normal and behavior is normal. Judgment and thought content normal.    Results for orders placed or performed in visit on 09/30/15  POCT CBC  Result Value Ref Range   WBC 5.5 4.6 - 10.2 K/uL   Lymph, poc 1.6 0.6 - 3.4   POC LYMPH PERCENT 29.6 10 - 50 %L   MID (cbc) 0.2 0 - 0.9   POC MID % 4.1 0 - 12 %M   POC Granulocyte 3.6 2 - 6.9   Granulocyte percent 66.3 37 - 80 %G   RBC 4.31 4.04 - 5.48 M/uL   Hemoglobin 13.5 12.2 - 16.2 g/dL   HCT, POC 16.1 09.6 - 47.9 %   MCV 88.3 80 - 97 fL   MCH, POC 31.2 27 - 31.2 pg   MCHC 35.3 31.8 - 35.4 g/dL   RDW, POC 04.5 %   Platelet Count, POC 280 142 - 424 K/uL   MPV 5.8 0 - 99.8 fL     Dg Abd Acute W/chest  09/30/2015  CLINICAL DATA:  Generalized abdominal pain for 10 days. Constipation. EXAM: DG ABDOMEN ACUTE W/ 1V CHEST COMPARISON:  None. FINDINGS: There is no evidence of dilated bowel loops or free intraperitoneal air. No radiopaque calculi identified. A moderate to large amount of stool is seen. Heart size and mediastinal contours are within normal limits. Both lungs are clear. IMPRESSION: No acute findings.  Moderate to large stool burden noted. No active cardiopulmonary disease. Electronically Signed   By: Myles Rosenthal M.D.   On: 09/30/2015 15:50          Assessment & Plan:   1. Generalized abdominal pain I suspect that this is due to the large stool burden. Recommend "clean out" with Miralax and Gatorade. If symptoms persist, re-evaluate. - DG Abd Acute W/Chest; Future  2. Other fatigue Likely related to problem #1 and her worry that something is wrong. Reassuring abdominal films and CBC. Await remaining lab results. - POCT CBC - Comprehensive metabolic panel - TSH   Fernande Bras, PA-C Physician Assistant-Certified [facility]

## 2015-10-10 ENCOUNTER — Other Ambulatory Visit: Payer: Self-pay

## 2015-10-10 DIAGNOSIS — R7989 Other specified abnormal findings of blood chemistry: Secondary | ICD-10-CM

## 2015-10-10 DIAGNOSIS — R945 Abnormal results of liver function studies: Principal | ICD-10-CM

## 2015-10-11 ENCOUNTER — Telehealth: Payer: Self-pay

## 2015-10-11 NOTE — Telephone Encounter (Signed)
Pt received her lab results that he liver enzymes were elevated and she would like to discuss this with chelle  Best number [phone]

## 2015-10-11 NOTE — Telephone Encounter (Signed)
Already spoken to the lab.

## 2015-10-13 ENCOUNTER — Encounter: Payer: Self-pay | Admitting: Physician Assistant

## 2015-10-22 ENCOUNTER — Other Ambulatory Visit (INDEPENDENT_AMBULATORY_CARE_PROVIDER_SITE_OTHER): Payer: BLUE CROSS/BLUE SHIELD | Admitting: *Deleted

## 2015-10-22 DIAGNOSIS — R945 Abnormal results of liver function studies: Principal | ICD-10-CM

## 2015-10-22 DIAGNOSIS — R7989 Other specified abnormal findings of blood chemistry: Secondary | ICD-10-CM | POA: Diagnosis not present

## 2015-10-22 LAB — HEPATITIS PANEL, ACUTE
HCV AB: NEGATIVE
Hep A IgM: NONREACTIVE
Hep B C IgM: NONREACTIVE
Hepatitis B Surface Ag: NEGATIVE

## 2015-10-22 NOTE — Progress Notes (Signed)
Lab visit only. 

## 2015-10-23 NOTE — Addendum Note (Signed)
Addended by: Fernande BrasJEFFERY, Tomasa Dobransky S on: 10/23/2015 04:55 PM   Modules accepted: Orders

## 2015-11-03 ENCOUNTER — Ambulatory Visit
Admission: RE | Admit: 2015-11-03 | Discharge: 2015-11-03 | Disposition: A | Discharge status: Home / Self Care | Payer: BLUE CROSS/BLUE SHIELD | Source: Ambulatory Visit | Attending: Physician Assistant | Admitting: Physician Assistant

## 2015-11-03 DIAGNOSIS — R7989 Other specified abnormal findings of blood chemistry: Secondary | ICD-10-CM

## 2015-11-03 DIAGNOSIS — R945 Abnormal results of liver function studies: Principal | ICD-10-CM

## 2015-11-04 ENCOUNTER — Other Ambulatory Visit: Payer: Self-pay

## 2015-11-05 ENCOUNTER — Other Ambulatory Visit: Payer: Self-pay | Admitting: Physician Assistant

## 2015-11-05 DIAGNOSIS — R7989 Other specified abnormal findings of blood chemistry: Secondary | ICD-10-CM

## 2015-11-05 DIAGNOSIS — R945 Abnormal results of liver function studies: Principal | ICD-10-CM

## 2016-04-13 ENCOUNTER — Ambulatory Visit: Payer: BLUE CROSS/BLUE SHIELD

## 2017-09-01 IMAGING — US US ABDOMEN LIMITED
1 series · 14 of 25 positions shown · non-contrast
Comparison: No recent prior.

CLINICAL DATA: Elevated LFTs.

EXAM:
US ABDOMEN LIMITED - RIGHT UPPER QUADRANT

[Series 1: us abdomen limited · 0.21mm/px · 14 of 38 slices shown]
[im 1/38]
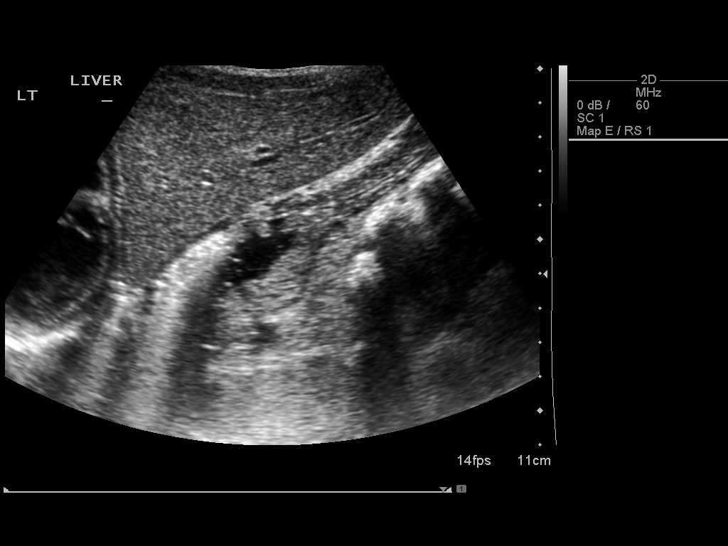
[im 4/38]
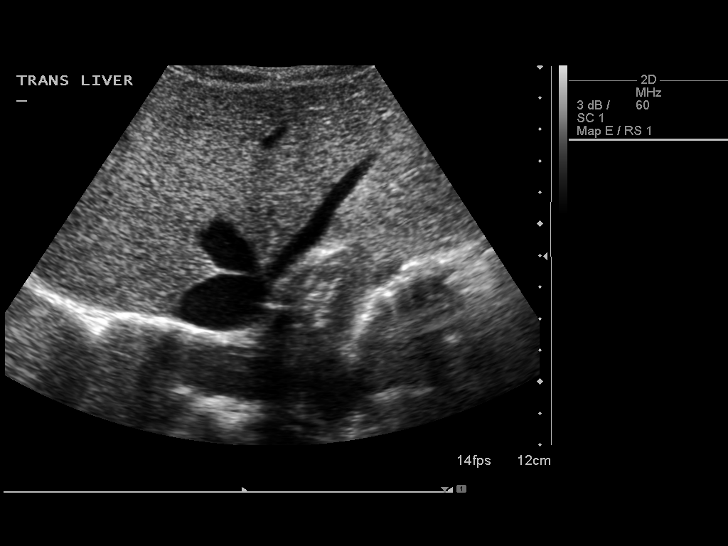
[im 7/38]
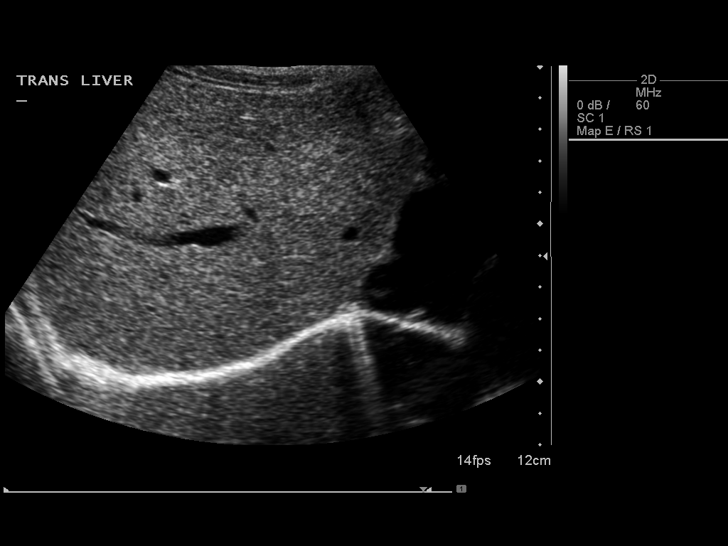
[im 10/38]
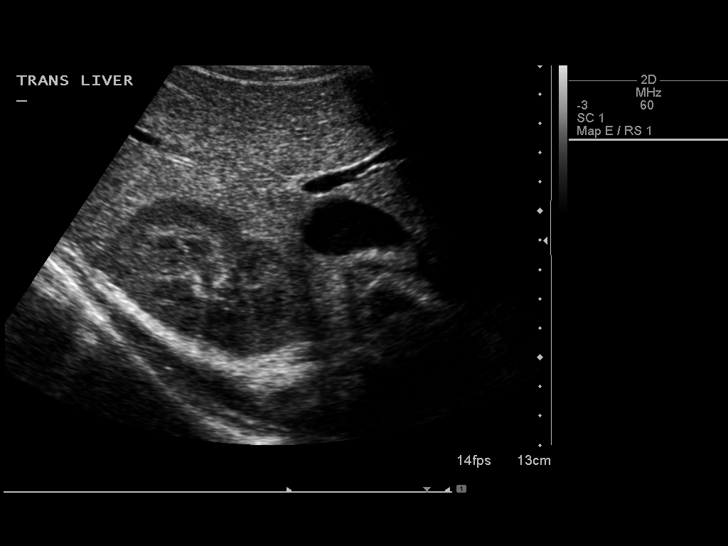
[im 13/38]
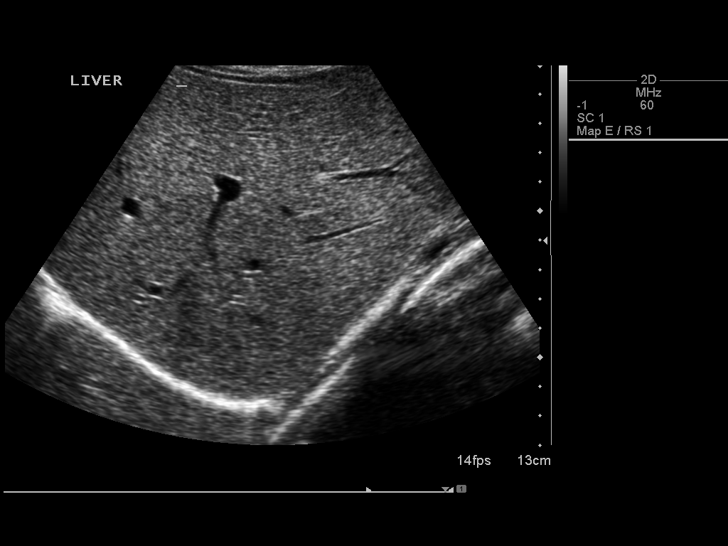
[im 14/38]
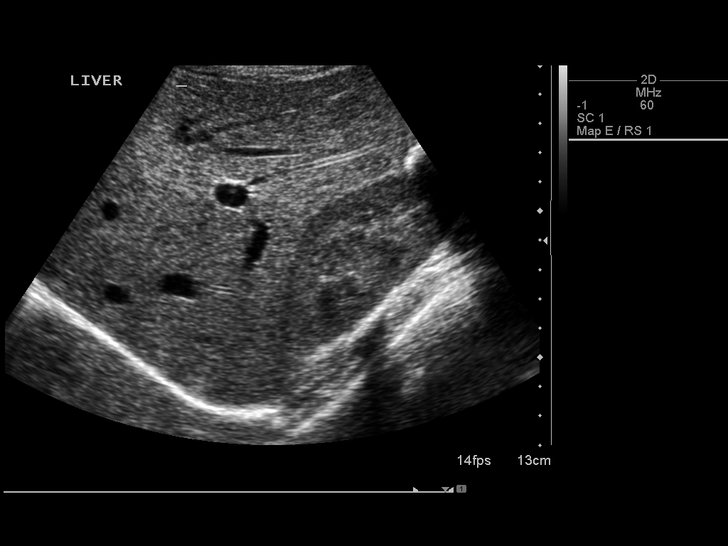
[im 17/38]
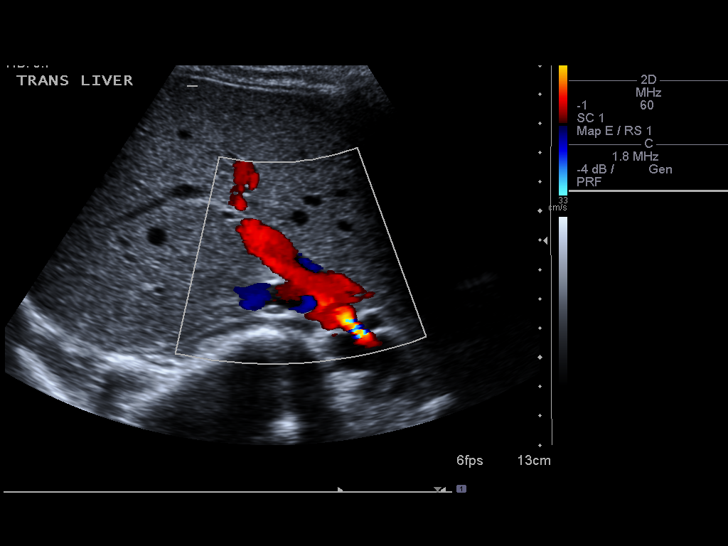
[im 21/38]
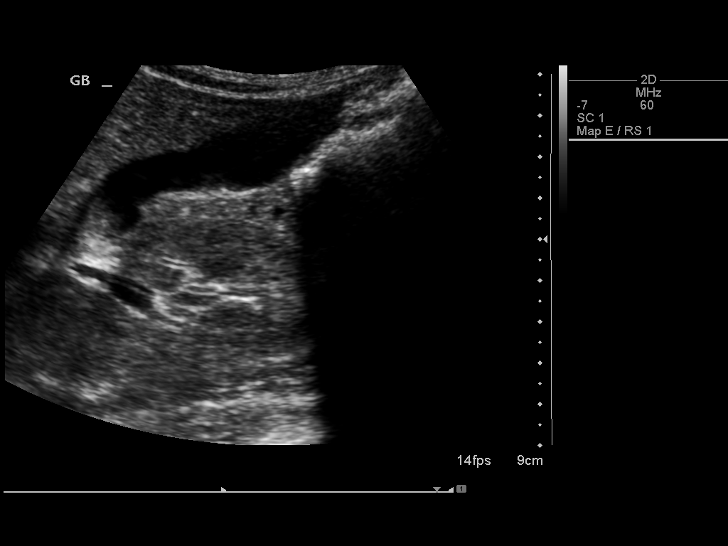
[im 24/38]
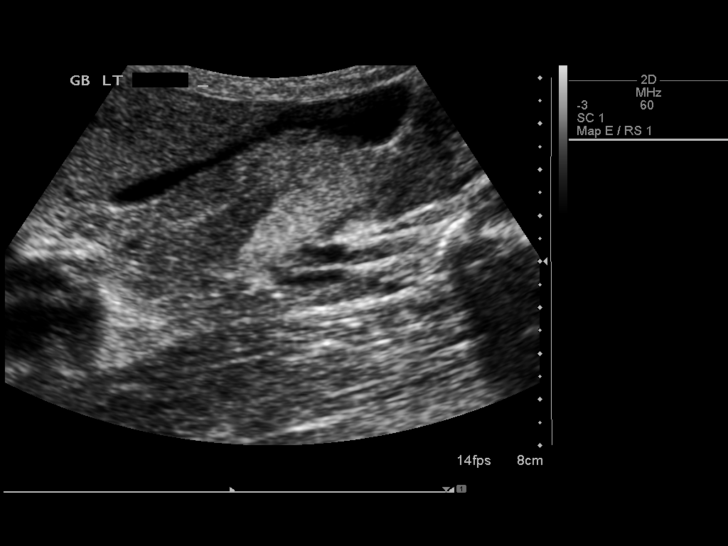
[im 25/38]
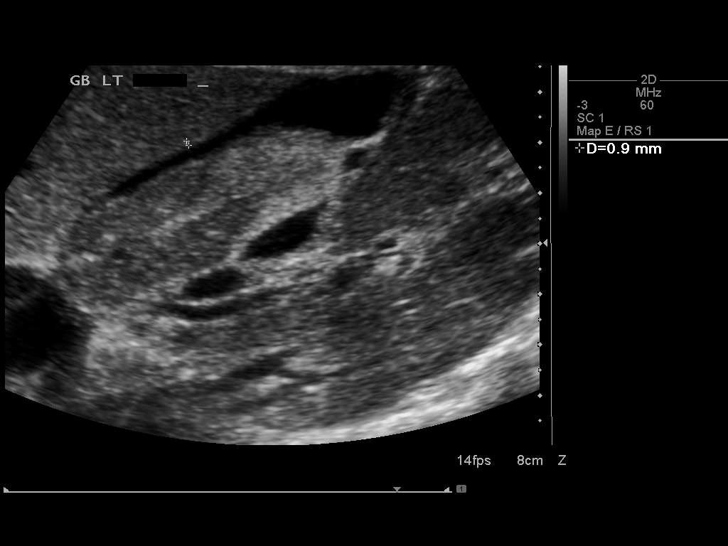
[im 28/38]
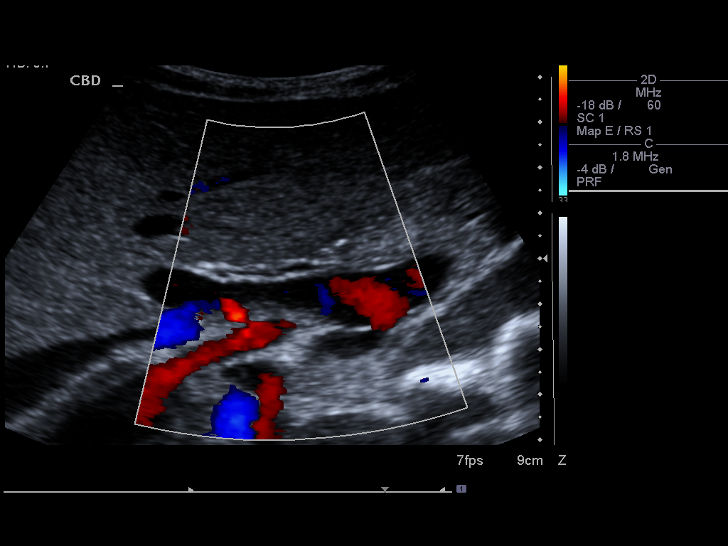
[im 31/38]
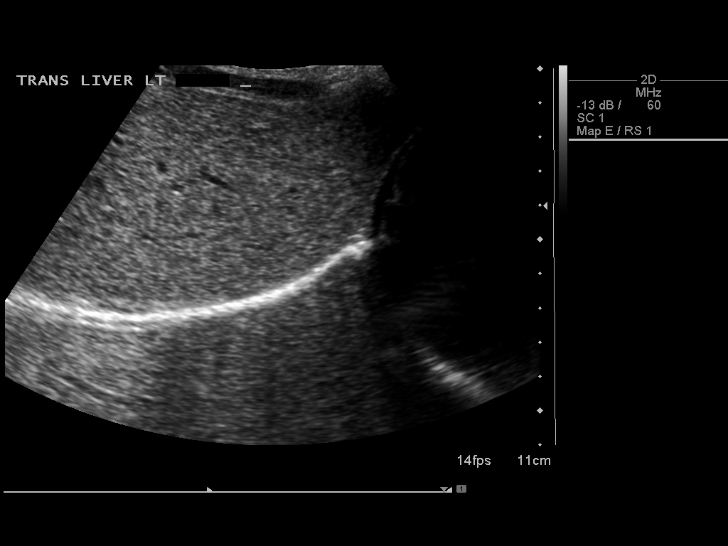
[im 34/38]
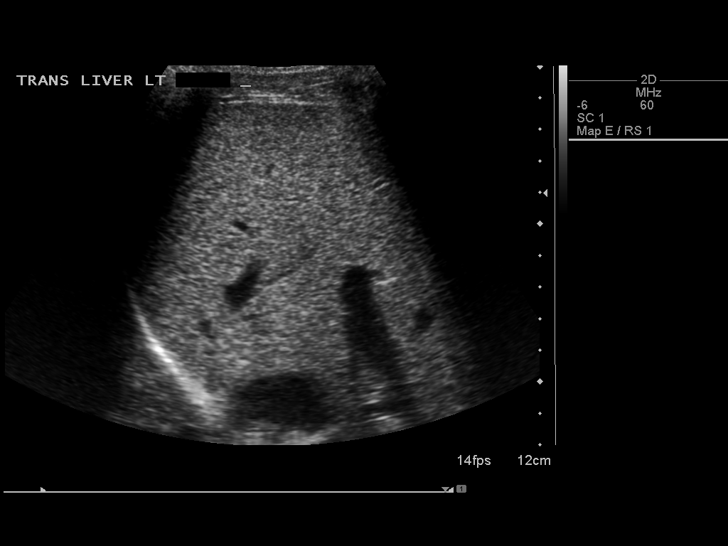
[im 38/38]
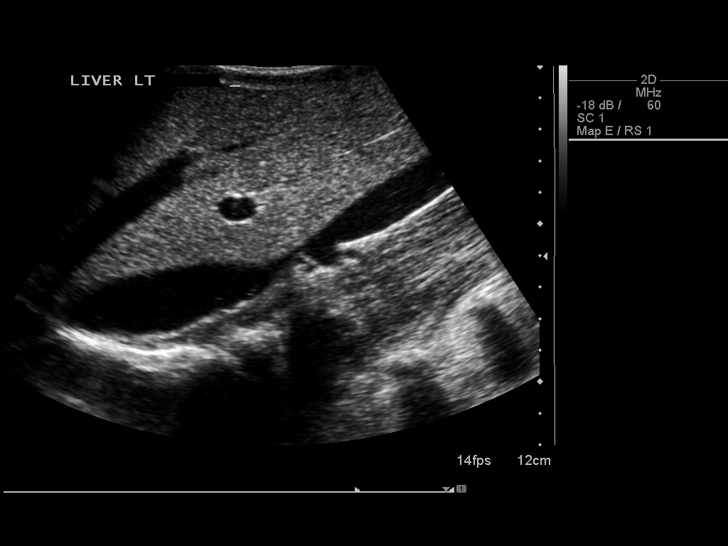

[14 of 25 positions shown; findings below may reference images not displayed]

FINDINGS: Gallbladder:

No gallstones or wall thickening visualized. No sonographic Murphy
sign noted by sonographer.

Common bile duct:

Diameter: 2.8 mm

Liver:

No focal lesion identified. Within normal limits in parenchymal
echogenicity.
IMPRESSION: Normal exam.

## 2019-08-10 DEATH — deceased
